# Patient Record
Sex: Female | Born: 1995 | Race: White | Hispanic: No | Marital: Single
Health system: Southern US, Community
[De-identification: ages and names within clinical notes are randomized; demographics above are authoritative.]

---
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
## 2020-07-29 ENCOUNTER — Other Ambulatory Visit: Payer: Self-pay

## 2020-10-01 NOTE — Telephone Encounter (Signed)
Called patient to set up an appointment with her to discuss her current challenges and to provide her with resources but voicemail was full and social worker was unable to leave a message.

## 2020-10-07 NOTE — Telephone Encounter (Signed)
This encounter was created in error - please disregard.

## 2021-07-05 ENCOUNTER — Other Ambulatory Visit (HOSPITAL_COMMUNITY): Payer: Self-pay
